# Patient Record
Sex: Male | Born: 1990 | Race: White | Hispanic: No | Smoking: Never smoker
Health system: Southern US, Community
[De-identification: ages and names within clinical notes are randomized; demographics above are authoritative.]

---

## 2019-06-24 ENCOUNTER — Emergency Department (HOSPITAL_COMMUNITY)
Admission: EM | Admit: 2019-06-24 | Discharge: 2019-06-24 | Disposition: A | Discharge status: Home / Self Care | Payer: Self-pay | Attending: Emergency Medicine | Admitting: Emergency Medicine

## 2019-06-24 ENCOUNTER — Encounter (HOSPITAL_COMMUNITY): Payer: Self-pay

## 2019-06-24 ENCOUNTER — Other Ambulatory Visit: Payer: Self-pay

## 2019-06-24 ENCOUNTER — Emergency Department (HOSPITAL_COMMUNITY): Payer: Self-pay

## 2019-06-24 DIAGNOSIS — S40212A Abrasion of left shoulder, initial encounter: Secondary | ICD-10-CM | POA: Insufficient documentation

## 2019-06-24 DIAGNOSIS — S40211A Abrasion of right shoulder, initial encounter: Secondary | ICD-10-CM | POA: Insufficient documentation

## 2019-06-24 DIAGNOSIS — S60511A Abrasion of right hand, initial encounter: Secondary | ICD-10-CM | POA: Insufficient documentation

## 2019-06-24 DIAGNOSIS — Z23 Encounter for immunization: Secondary | ICD-10-CM | POA: Insufficient documentation

## 2019-06-24 DIAGNOSIS — Y92410 Unspecified street and highway as the place of occurrence of the external cause: Secondary | ICD-10-CM | POA: Insufficient documentation

## 2019-06-24 DIAGNOSIS — T07XXXA Unspecified multiple injuries, initial encounter: Secondary | ICD-10-CM

## 2019-06-24 DIAGNOSIS — S30811A Abrasion of abdominal wall, initial encounter: Secondary | ICD-10-CM | POA: Insufficient documentation

## 2019-06-24 DIAGNOSIS — S80212A Abrasion, left knee, initial encounter: Secondary | ICD-10-CM | POA: Insufficient documentation

## 2019-06-24 DIAGNOSIS — Y93I9 Activity, other involving external motion: Secondary | ICD-10-CM | POA: Insufficient documentation

## 2019-06-24 DIAGNOSIS — Y999 Unspecified external cause status: Secondary | ICD-10-CM | POA: Insufficient documentation

## 2019-06-24 DIAGNOSIS — M545 Low back pain: Secondary | ICD-10-CM | POA: Insufficient documentation

## 2019-06-24 MED ORDER — HYDROCODONE-ACETAMINOPHEN 5-325 MG PO TABS: 1.0000 | ORAL_TABLET | ORAL | 0 refills | Status: AC | PRN

## 2019-06-24 MED ORDER — METHOCARBAMOL 750 MG PO TABS: 750.0000 mg | ORAL_TABLET | Freq: Four times a day (QID) | ORAL | 0 refills | Status: AC

## 2019-06-24 MED ORDER — BACITRACIN ZINC 500 UNIT/GM EX OINT
TOPICAL_OINTMENT | Freq: Every day | CUTANEOUS | Status: DC
  Administered 2019-06-24: 20:00:00 via TOPICAL
  Filled 2019-06-24: qty 27

## 2019-06-24 MED ORDER — TETANUS-DIPHTH-ACELL PERTUSSIS 5-2.5-18.5 LF-MCG/0.5 IM SUSP
0.5000 mL | Freq: Once | INTRAMUSCULAR | Status: AC
  Administered 2019-06-24: 0.5 mL via INTRAMUSCULAR
  Filled 2019-06-24: qty 0.5

## 2019-06-24 NOTE — ED Notes (Signed)
Wounds dressed with bacitracin and Vaseline gauze. Pt instructed to wash with soap and water later at home.
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
## 2019-07-07 ENCOUNTER — Emergency Department (HOSPITAL_COMMUNITY): Payer: Self-pay

## 2019-07-07 ENCOUNTER — Emergency Department (HOSPITAL_COMMUNITY)
Admission: EM | Admit: 2019-07-07 | Discharge: 2019-07-07 | Disposition: A | Discharge status: Home / Self Care | Payer: Self-pay | Attending: Emergency Medicine | Admitting: Emergency Medicine

## 2019-07-07 ENCOUNTER — Encounter (HOSPITAL_COMMUNITY): Payer: Self-pay

## 2019-07-07 ENCOUNTER — Other Ambulatory Visit: Payer: Self-pay

## 2019-07-07 DIAGNOSIS — R0789 Other chest pain: Secondary | ICD-10-CM | POA: Insufficient documentation

## 2019-07-07 LAB — CBC
HCT: 43.9 % (ref 39.0–52.0)
Hemoglobin: 15.2 g/dL (ref 13.0–17.0)
MCH: 30.4 pg (ref 26.0–34.0)
MCHC: 34.6 g/dL (ref 30.0–36.0)
MCV: 87.8 fL (ref 80.0–100.0)
Platelets: 260 K/uL (ref 150–400)
RBC: 5 MIL/uL (ref 4.22–5.81)
RDW: 11.8 % (ref 11.5–15.5)
WBC: 7.7 K/uL (ref 4.0–10.5)
nRBC: 0 % (ref 0.0–0.2)

## 2019-07-07 LAB — BASIC METABOLIC PANEL WITH GFR
Anion gap: 11 (ref 5–15)
BUN: 22 mg/dL — ABNORMAL HIGH (ref 6–20)
CO2: 25 mmol/L (ref 22–32)
Calcium: 9.4 mg/dL (ref 8.9–10.3)
Chloride: 100 mmol/L (ref 98–111)
Creatinine, Ser: 1.21 mg/dL (ref 0.61–1.24)
GFR calc Af Amer: >60 mL/min (ref >60)
GFR calc non Af Amer: >60 mL/min (ref >60)
Glucose, Bld: 103 mg/dL — ABNORMAL HIGH (ref 70–99)
Potassium: 3.8 mmol/L (ref 3.5–5.1)
Sodium: 136 mmol/L (ref 135–145)

## 2019-07-07 LAB — TROPONIN I (HIGH SENSITIVITY)
Troponin I (High Sensitivity): <2 ng/L (ref <18)
Troponin I (High Sensitivity): <2 ng/L (ref <18)

## 2019-07-07 LAB — D-DIMER, QUANTITATIVE: D-Dimer, Quant: 1.1 ug/mL-FEU — ABNORMAL HIGH (ref 0.00–0.50)

## 2019-07-07 MED ORDER — IOHEXOL 350 MG/ML SOLN
100.0000 mL | Freq: Once | INTRAVENOUS | Status: AC | PRN
  Administered 2019-07-07: 100 mL via INTRAVENOUS

## 2019-07-07 MED ORDER — SODIUM CHLORIDE 0.9% FLUSH
3.0000 mL | Freq: Once | INTRAVENOUS | Status: AC
  Administered 2019-07-07: 3 mL via INTRAVENOUS

## 2019-07-07 MED ORDER — SODIUM CHLORIDE (PF) 0.9 % IJ SOLN
INTRAMUSCULAR | Status: AC
  Administered 2019-07-07: 12:00:00
  Filled 2019-07-07: qty 50
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
## 2019-07-09 ENCOUNTER — Other Ambulatory Visit: Payer: Self-pay

## 2019-07-09 ENCOUNTER — Emergency Department (HOSPITAL_COMMUNITY)
Admission: EM | Admit: 2019-07-09 | Discharge: 2019-07-09 | Disposition: A | Discharge status: Home / Self Care | Payer: Self-pay | Attending: Emergency Medicine | Admitting: Emergency Medicine

## 2019-07-09 ENCOUNTER — Encounter (HOSPITAL_COMMUNITY): Payer: Self-pay | Admitting: Emergency Medicine

## 2019-07-09 DIAGNOSIS — R0789 Other chest pain: Secondary | ICD-10-CM | POA: Insufficient documentation

## 2019-07-09 DIAGNOSIS — Z79899 Other long term (current) drug therapy: Secondary | ICD-10-CM | POA: Insufficient documentation

## 2019-07-09 DIAGNOSIS — F419 Anxiety disorder, unspecified: Secondary | ICD-10-CM | POA: Insufficient documentation

## 2019-07-09 MED ORDER — HYDROXYZINE HCL 25 MG PO TABS
25.0000 mg | ORAL_TABLET | Freq: Once | ORAL | Status: AC
  Administered 2019-07-09: 13:00:00 25 mg via ORAL
  Filled 2019-07-09: qty 1

## 2019-07-09 MED ORDER — HYDROXYZINE HCL 25 MG PO TABS: 25.0000 mg | ORAL_TABLET | Freq: Four times a day (QID) | ORAL | 0 refills | Status: AC
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
## 2019-07-09 NOTE — Discharge Instructions (Addendum)
I am prescribing you hydroxyzine. Take as prescribed. Schedule an appointment with the primary care doctor I have provided to establish care. As discussed, they are able to place you on a longterm medication for anxiety as needed. Return to the ER if you have new or worsening symptoms

## 2019-07-09 NOTE — ED Notes (Signed)
An After Visit Summary was printed and given to the patient. Discharge instructions given and no further questions at this time.  
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
## 2020-10-20 IMAGING — CT CT ANGIO CHEST
2 of 6 series · 19 of 36 positions shown · IV contrast (omnipaque)
Comparison: None.

CLINICAL DATA: Positive d dimer, suspected pulmonary embolus

EXAM:
CT ANGIOGRAPHY CHEST WITH CONTRAST
TECHNIQUE: Multidetector CT imaging of the chest was performed using the
standard protocol during bolus administration of intravenous
contrast. Multiplanar CT image reconstructions and MIPs were
obtained to evaluate the vascular anatomy.
CONTRAST:  100mL OMNIPAQUE IOHEXOL 350 MG/ML SOLN

[Series 5: thins · axial · 0.87mm/px · z∈[-651,-397]mm · 18 of 284 slices shown]
[im 15/284  lung]
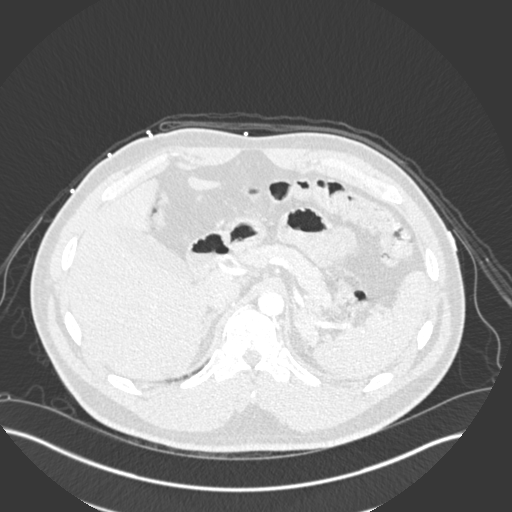
[im 29/284  mediastinal]
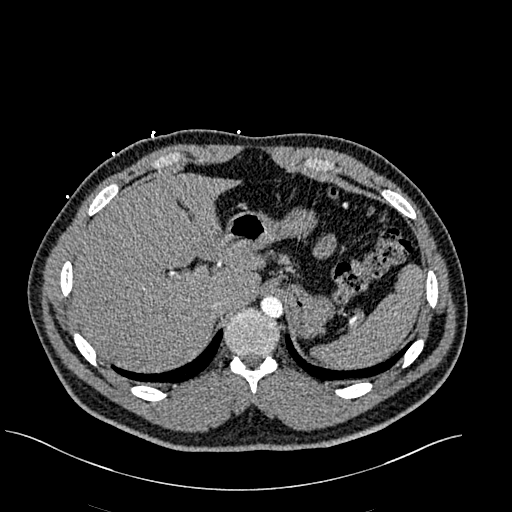
[im 43/284  lung]
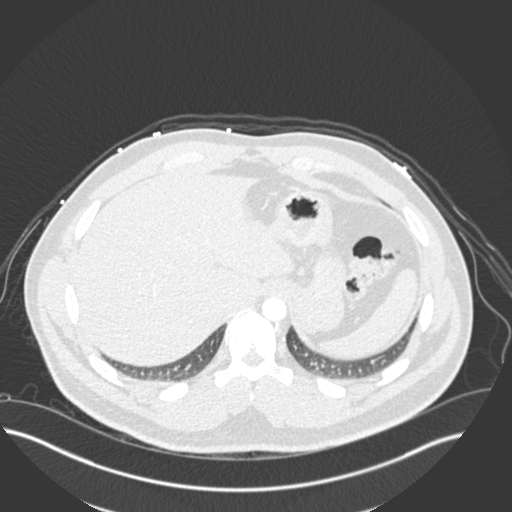
[im 57/284  mediastinal]
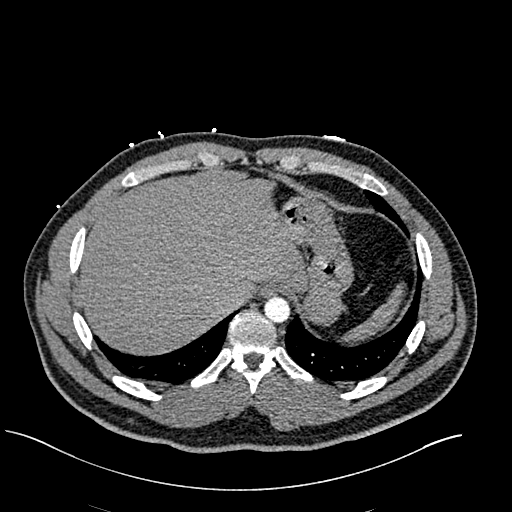
[im 71/284  lung]
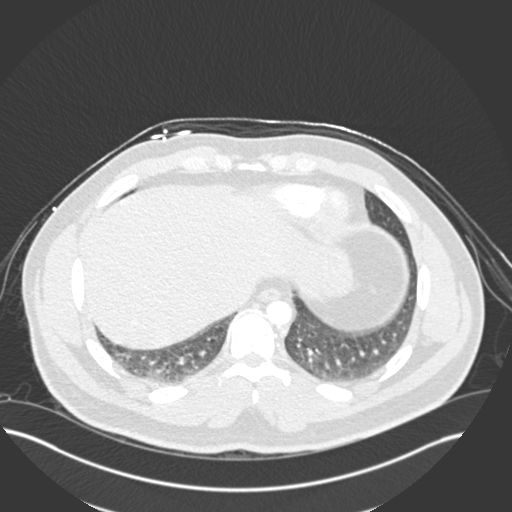
[im 85/284  mediastinal]
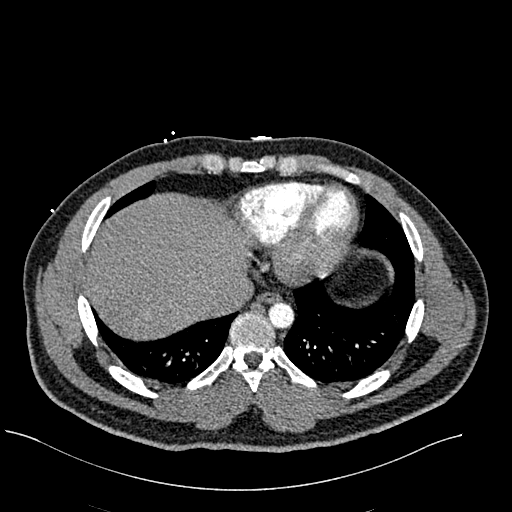
[im 100/284  lung]
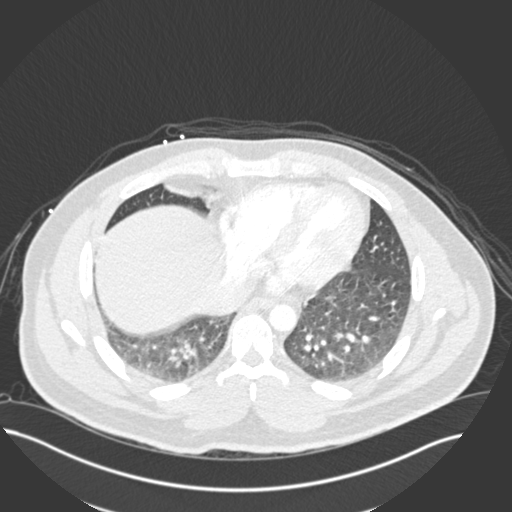
[im 114/284  mediastinal]
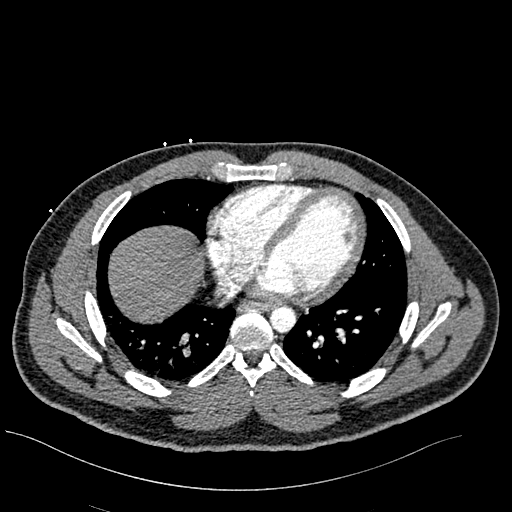
[im 128/284  lung]
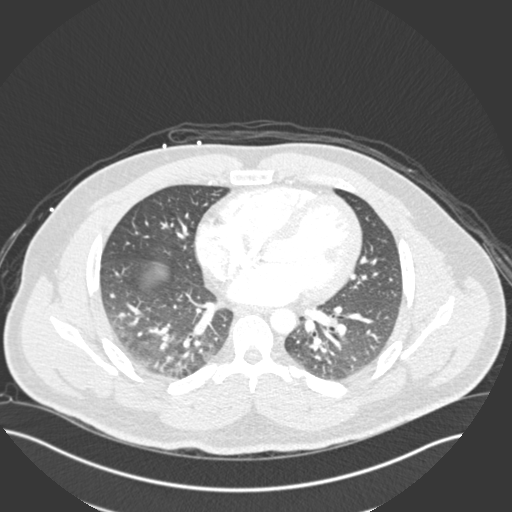
[im 156/284  mediastinal]
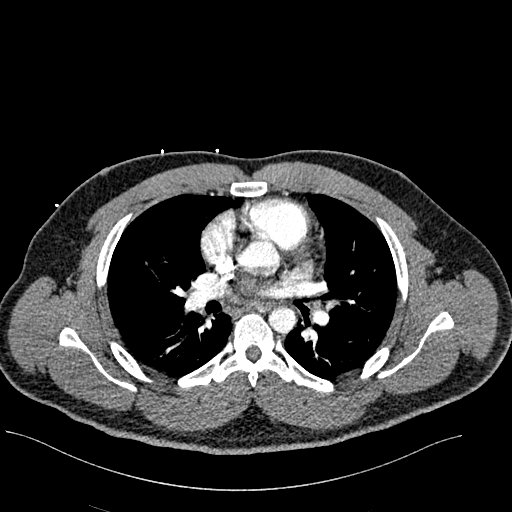
[im 170/284  lung]
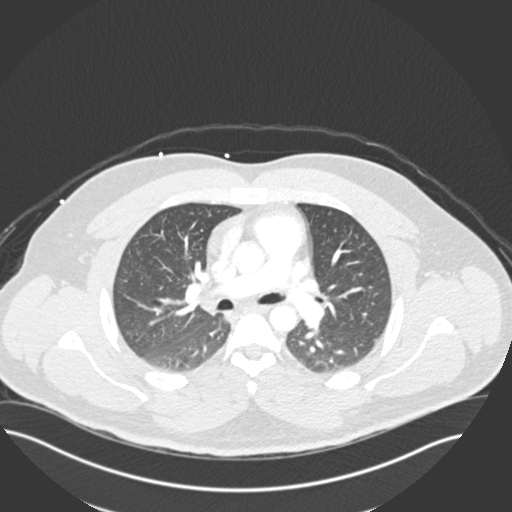
[im 184/284  mediastinal]
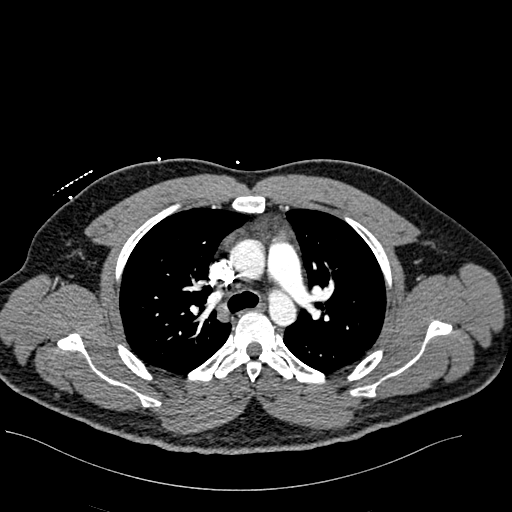
[im 199/284  lung]
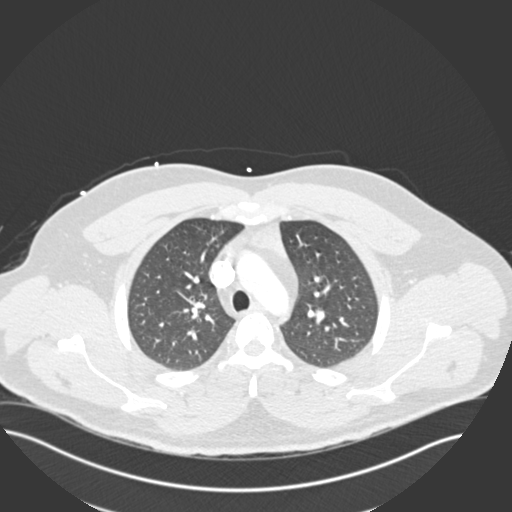
[im 213/284  mediastinal]
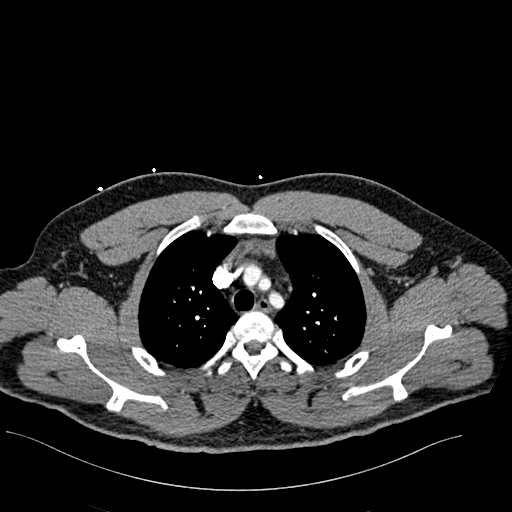
[im 227/284  lung]
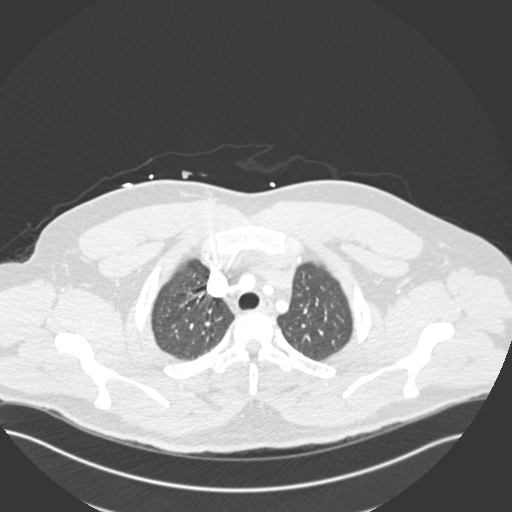
[im 241/284  mediastinal]
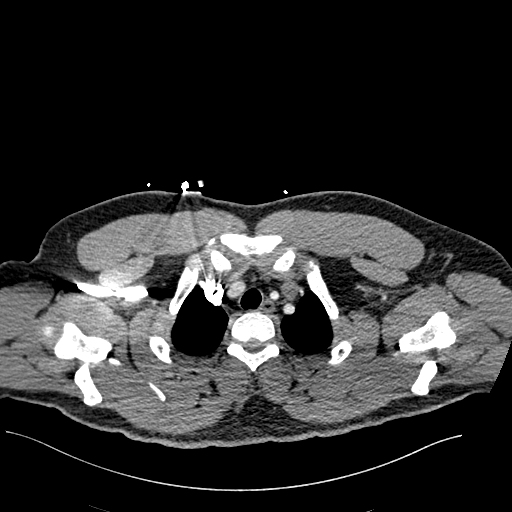
[im 255/284  lung]
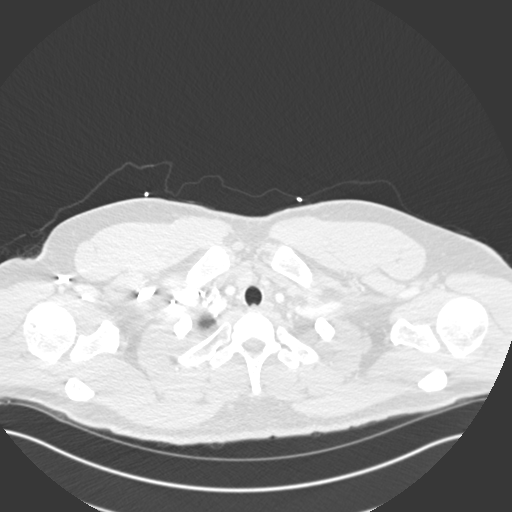
[im 269/284  mediastinal]
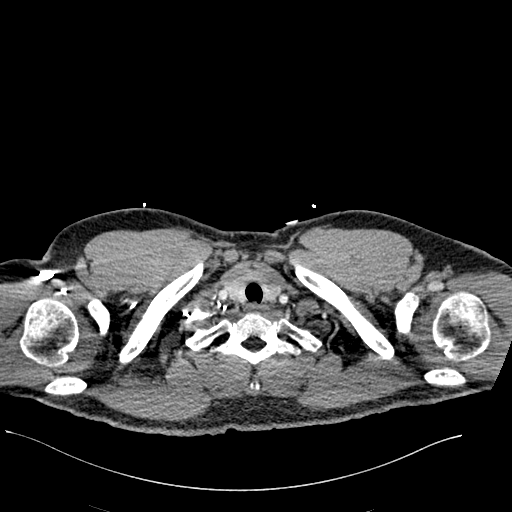

[Series 6: coronal mpr · coronal · 0.57mm/px · 1 of 144 slices shown]
[im 72/144  mediastinal]
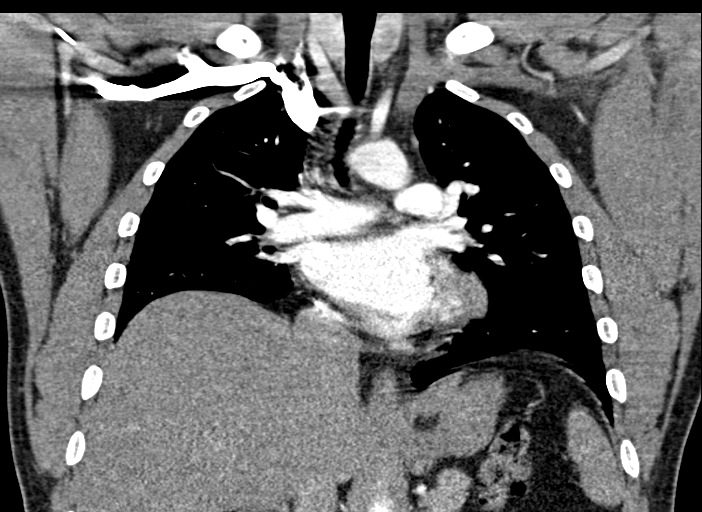

[19 of 36 positions shown; findings below may reference images not displayed]

FINDINGS: Cardiovascular: Opacification of the pulmonary arteries to the
proximal segmental level. More distal evaluation is limited. No
evidence of pulmonary embolism. Normal heart size. No pericardial
effusion.

Mediastinum/Nodes: No enlarged mediastinal, hilar, or axillary lymph
nodes. Thyroid gland, trachea, and esophagus demonstrate no
significant findings.

Lungs/Pleura: Lungs are clear. No pleural effusion or pneumothorax.

Upper Abdomen: No acute abnormality.

Musculoskeletal: No chest wall abnormality. No acute or significant
osseous findings.

Review of the MIP images confirms the above findings.
IMPRESSION: No evidence of central, lobar, or proximal segmental pulmonary
embolism. Suboptimal distal evaluation.

## 2020-10-20 IMAGING — CR DG CHEST 2V
2 series · 2 of 2 positions shown · non-contrast
Comparison: None.

CLINICAL DATA: Onset chest pain and syncopal episodes 07/03/2019.

EXAM:
CHEST - 2 VIEW

[w chest pa]
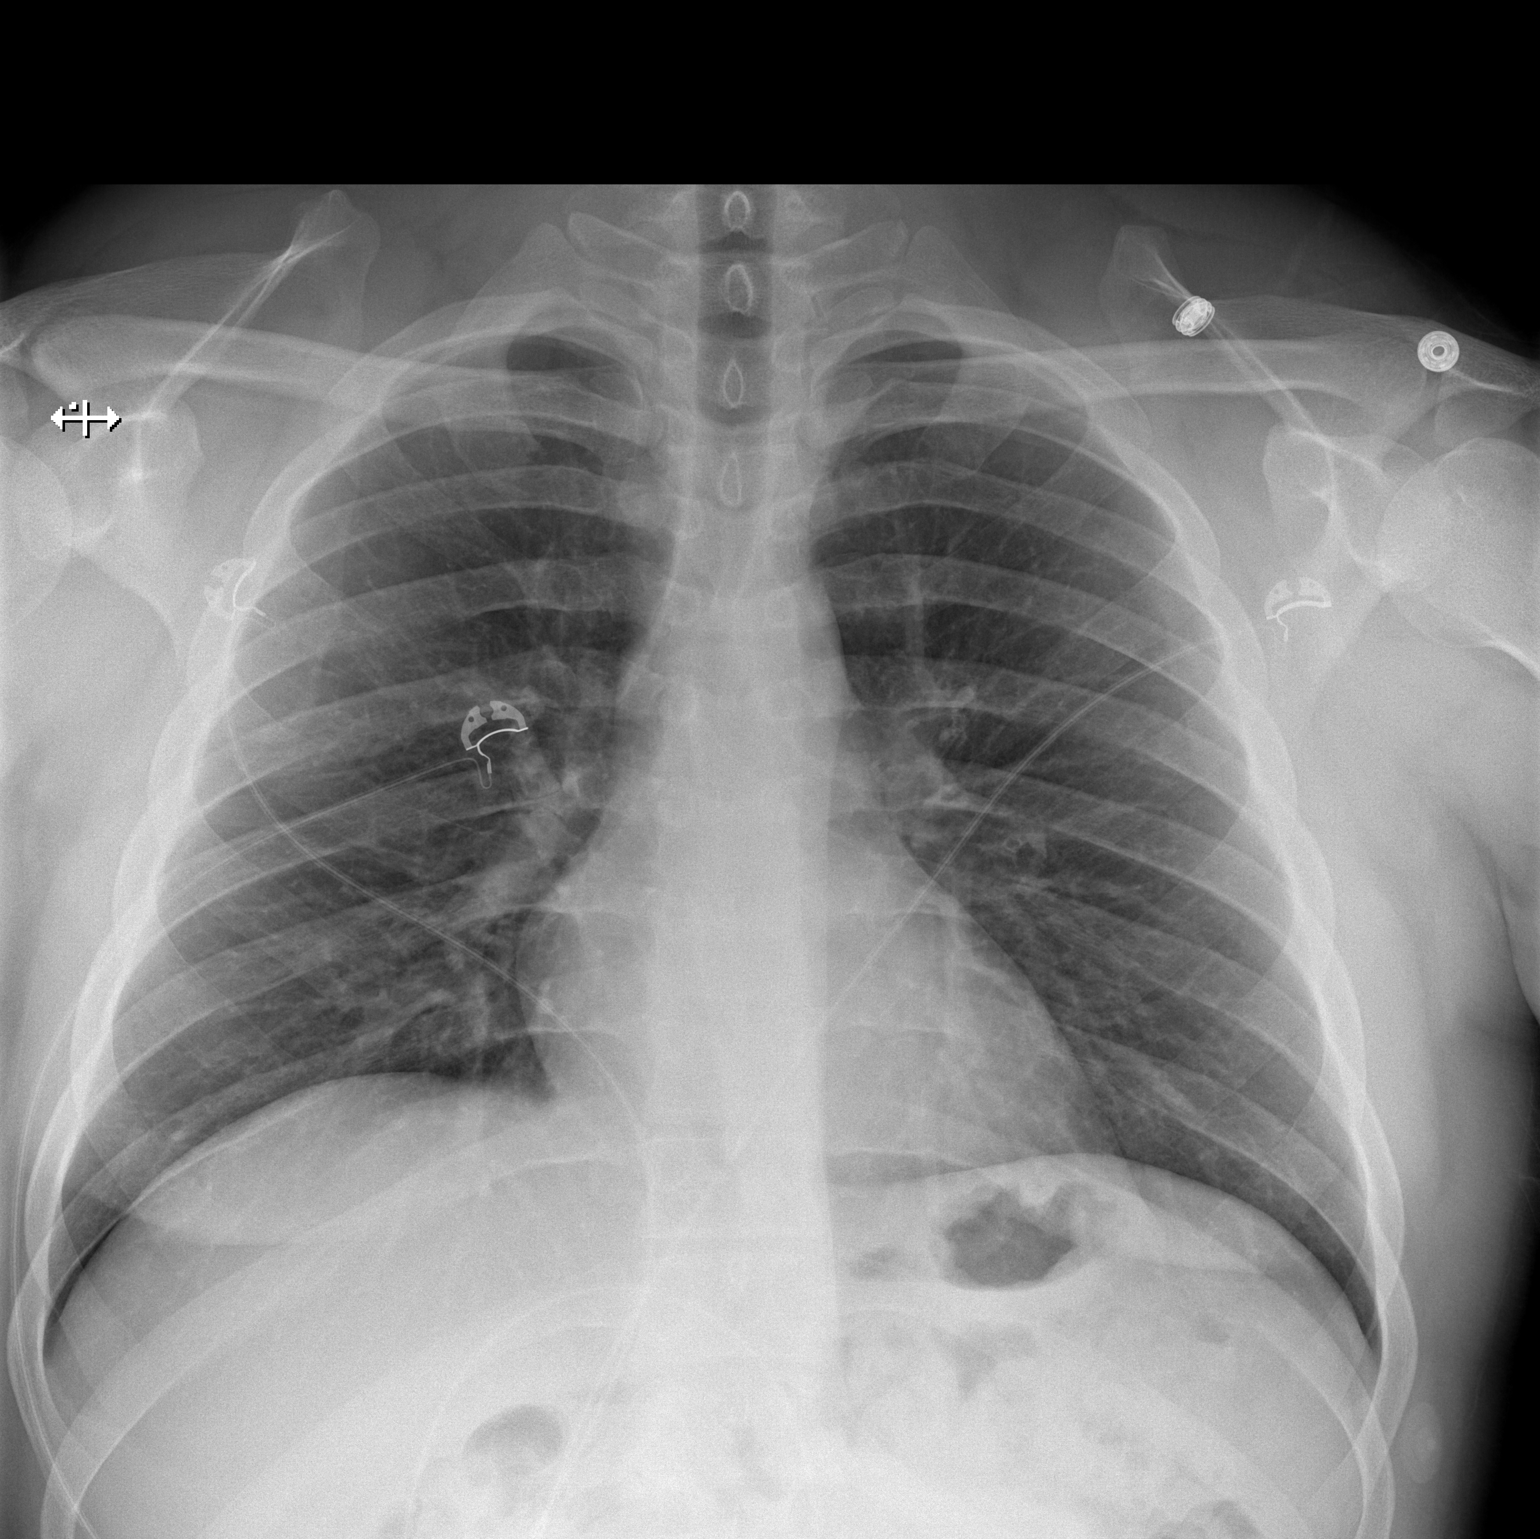

[w chest lat]
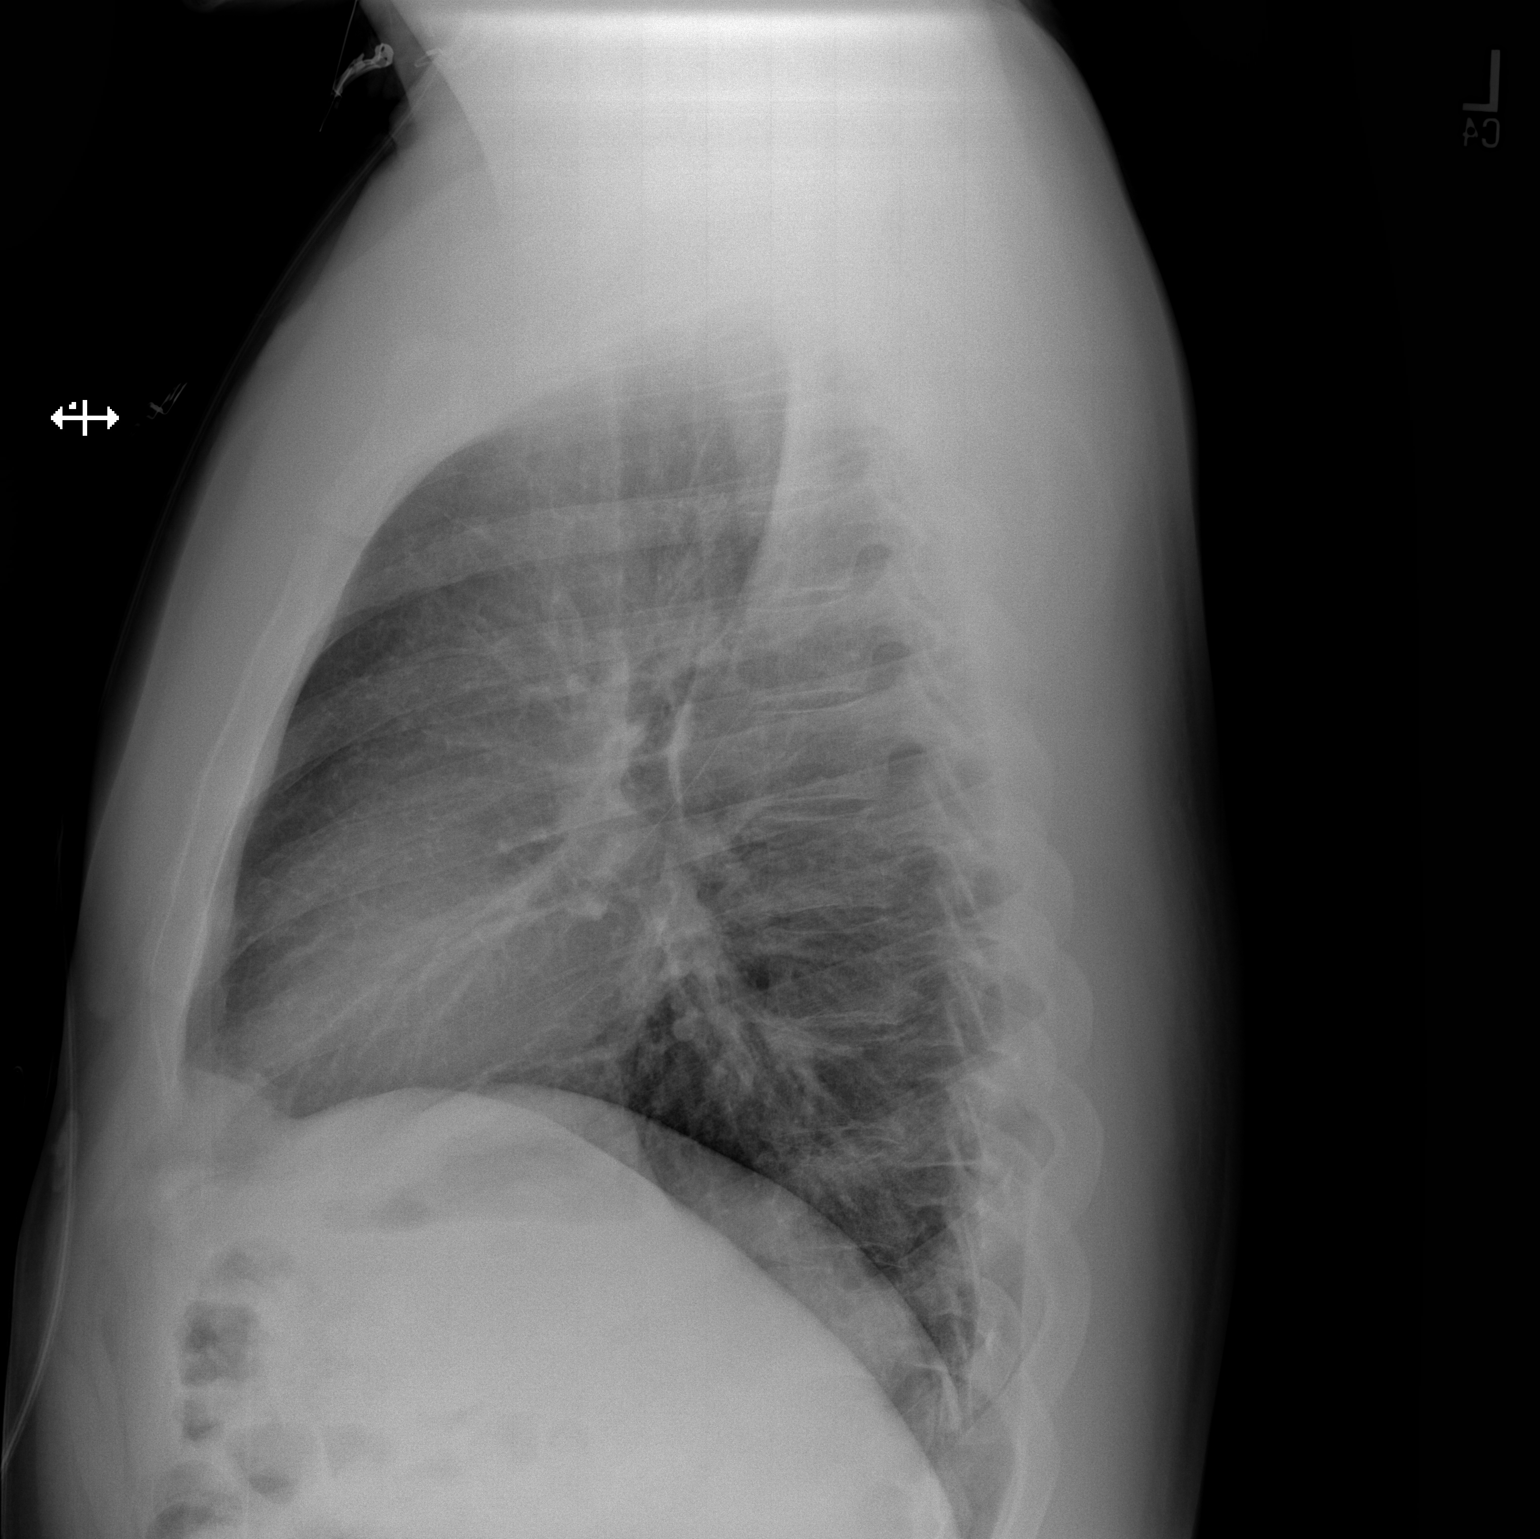

[2 of 2 positions shown; findings below may reference images not displayed]

FINDINGS: The lungs are clear. Heart size is normal. No pneumothorax or
pleural fluid. No acute or focal bony abnormality.
IMPRESSION: Normal chest.
